# Patient Record
Sex: Male | Born: 1995 | Race: Black or African American | Hispanic: No | Marital: Single | Smoking: Never smoker
Health system: Southern US, Community
[De-identification: ages and names within clinical notes are randomized; demographics above are authoritative.]

## PROBLEM LIST (undated history)

## (undated) HISTORY — PX: NO PAST SURGERIES: SHX2092

---
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
## 2013-06-29 ENCOUNTER — Ambulatory Visit: Payer: Self-pay | Admitting: Medical

## 2013-06-29 LAB — RAPID INFLUENZA A&B ANTIGENS (ARMC ONLY)

## 2013-06-29 LAB — RAPID STREP-A WITH REFLX: Micro Text Report: POSITIVE

## 2013-10-26 ENCOUNTER — Ambulatory Visit: Payer: Self-pay | Admitting: Family Medicine

## 2015-03-31 ENCOUNTER — Encounter

## 2015-03-31 DIAGNOSIS — S86812A Strain of other muscle(s) and tendon(s) at lower leg level, left leg, initial encounter: Secondary | ICD-10-CM | POA: Diagnosis not present

## 2015-03-31 DIAGNOSIS — S86912A Strain of unspecified muscle(s) and tendon(s) at lower leg level, left leg, initial encounter: Secondary | ICD-10-CM

## 2015-03-31 DIAGNOSIS — M25562 Pain in left knee: Secondary | ICD-10-CM | POA: Diagnosis not present

## 2015-06-15 ENCOUNTER — Encounter

## 2015-06-15 ENCOUNTER — Encounter: Payer: Self-pay | Admitting: Emergency Medicine

## 2015-06-15 DIAGNOSIS — R079 Chest pain, unspecified: Secondary | ICD-10-CM

## 2015-06-15 DIAGNOSIS — R0602 Shortness of breath: Secondary | ICD-10-CM | POA: Insufficient documentation

## 2015-06-15 MED ADMIN — Aspirin Chew Tab 81 MG: 324 mg | ORAL | NDC 63739043401

## 2015-08-20 ENCOUNTER — Encounter: Payer: Self-pay | Admitting: *Deleted

## 2015-08-20 ENCOUNTER — Encounter

## 2015-08-20 DIAGNOSIS — R0789 Other chest pain: Secondary | ICD-10-CM

## 2015-09-09 ENCOUNTER — Encounter

## 2019-12-03 ENCOUNTER — Encounter: Payer: Self-pay | Admitting: Emergency Medicine

## 2019-12-03 ENCOUNTER — Encounter

## 2019-12-03 DIAGNOSIS — S29019A Strain of muscle and tendon of unspecified wall of thorax, initial encounter: Secondary | ICD-10-CM

## 2020-03-10 ENCOUNTER — Encounter: Payer: Self-pay | Admitting: Emergency Medicine

## 2020-03-10 DIAGNOSIS — M25562 Pain in left knee: Secondary | ICD-10-CM

## 2020-08-25 ENCOUNTER — Encounter

## 2020-08-25 ENCOUNTER — Encounter: Payer: Self-pay | Admitting: Emergency Medicine

## 2020-08-25 DIAGNOSIS — S92424A Nondisplaced fracture of distal phalanx of right great toe, initial encounter for closed fracture: Secondary | ICD-10-CM

## 2020-08-25 DIAGNOSIS — Y9367 Activity, basketball: Secondary | ICD-10-CM | POA: Diagnosis not present

## 2020-10-09 ENCOUNTER — Encounter

## 2020-10-09 DIAGNOSIS — S46912A Strain of unspecified muscle, fascia and tendon at shoulder and upper arm level, left arm, initial encounter: Secondary | ICD-10-CM

## 2020-10-09 DIAGNOSIS — M25512 Pain in left shoulder: Secondary | ICD-10-CM

## 2021-01-14 ENCOUNTER — Encounter

## 2021-01-14 ENCOUNTER — Other Ambulatory Visit: Payer: Self-pay

## 2021-01-14 ENCOUNTER — Encounter: Payer: Self-pay | Admitting: *Deleted

## 2021-01-14 ENCOUNTER — Emergency Department: Payer: Self-pay

## 2021-01-14 DIAGNOSIS — Z23 Encounter for immunization: Secondary | ICD-10-CM | POA: Insufficient documentation

## 2021-01-14 DIAGNOSIS — Y9364 Activity, baseball: Secondary | ICD-10-CM | POA: Insufficient documentation

## 2021-01-14 DIAGNOSIS — S022XXA Fracture of nasal bones, initial encounter for closed fracture: Secondary | ICD-10-CM | POA: Insufficient documentation

## 2021-01-14 DIAGNOSIS — S0990XA Unspecified injury of head, initial encounter: Secondary | ICD-10-CM | POA: Insufficient documentation

## 2021-01-14 DIAGNOSIS — W2107XA Struck by softball, initial encounter: Secondary | ICD-10-CM | POA: Insufficient documentation

## 2021-01-14 NOTE — ED Triage Notes (Signed)
Pt was hit in the nose tonight with a softball.  States the ball took a hop and struck pt in the nose.  Small lac to bridge of nose.  Nose swollen.  Dried blood noticed in nares.  No loc  no n/v/  pt alert  speech clear.  Pt has a headache.

## 2021-01-15 ENCOUNTER — Emergency Department
Admission: EM | Admit: 2021-01-15 | Discharge: 2021-01-15 | Disposition: A | Discharge status: Home / Self Care | Payer: Self-pay | Attending: Emergency Medicine | Admitting: Emergency Medicine

## 2021-01-15 ENCOUNTER — Encounter: Payer: Self-pay | Admitting: Emergency Medicine

## 2021-01-15 DIAGNOSIS — S022XXA Fracture of nasal bones, initial encounter for closed fracture: Secondary | ICD-10-CM

## 2021-01-15 DIAGNOSIS — S0990XA Unspecified injury of head, initial encounter: Secondary | ICD-10-CM

## 2021-01-15 MED ORDER — IBUPROFEN 800 MG PO TABS
800.0000 mg | ORAL_TABLET | Freq: Once | ORAL | Status: AC
  Administered 2021-01-15: 800 mg via ORAL
  Filled 2021-01-15: qty 1

## 2021-01-15 MED ORDER — HYDROCODONE-ACETAMINOPHEN 5-325 MG PO TABS: 2.0000 | ORAL_TABLET | Freq: Four times a day (QID) | ORAL | 0 refills | Status: DC | PRN

## 2021-01-15 MED ORDER — BACITRACIN ZINC 500 UNIT/GM EX OINT
TOPICAL_OINTMENT | Freq: Once | CUTANEOUS | Status: AC
  Filled 2021-01-15: qty 0.9

## 2021-01-15 MED ORDER — ONDANSETRON 4 MG PO TBDP: 4.0000 mg | ORAL_TABLET | Freq: Four times a day (QID) | ORAL | 0 refills | Status: DC | PRN

## 2021-01-15 MED ORDER — IBUPROFEN 800 MG PO TABS
800.0000 mg | ORAL_TABLET | Freq: Three times a day (TID) | ORAL | 0 refills | Status: DC | PRN
Start: 2021-01-15 — End: 2022-07-22

## 2021-01-15 MED ORDER — TETANUS-DIPHTH-ACELL PERTUSSIS 5-2.5-18.5 LF-MCG/0.5 IM SUSY
0.5000 mL | PREFILLED_SYRINGE | Freq: Once | INTRAMUSCULAR | Status: AC
  Administered 2021-01-15: 0.5 mL via INTRAMUSCULAR
  Filled 2021-01-15: qty 0.5

## 2021-01-15 MED ORDER — ONDANSETRON 4 MG PO TBDP
4.0000 mg | ORAL_TABLET | Freq: Once | ORAL | Status: AC
  Administered 2021-01-15: 4 mg via ORAL
  Filled 2021-01-15: qty 1

## 2021-01-15 MED ORDER — HYDROCODONE-ACETAMINOPHEN 5-325 MG PO TABS
2.0000 | ORAL_TABLET | Freq: Once | ORAL | Status: AC
  Administered 2021-01-15: 2 via ORAL
  Filled 2021-01-15: qty 2

## 2021-01-15 NOTE — ED Notes (Signed)
EDP at bedside  

## 2021-01-15 NOTE — Discharge Instructions (Addendum)
CT IMPRESSION:  1. Comminuted and depressed nasal bone fracture involving both the  right and left nasal bone.  2. Fracture of the anterior aspect of the nasal septum with minimal  buckling.   You have a small laceration to the bridge of your nose that does not require sutures.  You may clean this area gently with warm soap and water and apply over-the-counter Neosporin once daily.  We have updated your tetanus vaccination today.  You have had a difficult head injury today.  Please avoid alcohol, sedatives, illicit drugs for the next week other medications that have been prescribed to you.  Please rest and drink plenty of water.  We recommend that you avoid any activity that may lead to another head injury for at least 1 week or until your symptoms have completely resolved.  We also recommend "brain rest" - please avoid TV, cell phones, tablets, computers as much as possible for the next 48 hours.  If you develop severe headache that is uncontrolled with pain medication at home, numbness or weakness on one side of your body, vomiting that does not stop, confusion, changes in your speech or vision, please return to the emergency department.  I recommend that you do not blowing your nose or put anything into your nose for the next 3 days.  I recommend sleeping with several pillows under your head or keeping the head of your bed elevated to help with pain, swelling.  You may apply ice to your face to help with pain and swelling.  You will likely develop black eyes over the next several days.  If you develop a nosebleed that does not stop with holding direct pressure for 30 minutes, please return to the emergency department.  I recommend follow-up with ENT in the next 2 weeks after your swelling has improved.   You are being provided a prescription for opiates (also known as narcotics) for pain control.  Opiates can be addictive and should only be used when absolutely necessary for pain control when other  alternatives do not work.  We recommend you only use them for the recommended amount of time and only as prescribed.  Please do not take with other sedative medications or alcohol.  Please do not drive, operate machinery, make important decisions while taking opiates.  Please note that these medications can be addictive and have high abuse potential.  Patients can become addicted to narcotics after only taking them for a few days.  Please keep these medications locked away from children, teenagers or any family members with history of substance abuse.  Narcotic pain medicine may also make you constipated.  You may use over-the-counter medications such as MiraLAX, Colace to prevent constipation.  If you become constipated you may use over-the-counter enemas as needed.  Itching and nausea are common side effects of narcotic pain medication.  If you develop uncontrolled vomiting or a rash, please stop these medications.    Steps to find a Primary Care Provider (PCP):  Call [phone] or [phone] to access "[facility] Find a Doctor Service."  2.  You may also go on the [facility] website at [email or web address]

## 2021-01-15 NOTE — ED Provider Notes (Signed)
[facility] Emergency Department Provider Note ____________________________________________   Event Date/Time   First MD Initiated Contact with Patient 01/15/21 0202     (approximate)  I have reviewed the triage vital signs and the nursing notes.   HISTORY  Chief Complaint Facial Injury    HPI Bernard Robinson is a 25 y.o. male with no significant past medical history who presents to the emergency department with complaints of nasal pain, headache after he was hit in the face with a softball tonight just prior to arrival.  Has a small abrasion to the bridge of his nose.  No loss of consciousness.  No neck or back pain.  No chest or abdominal pain.  No numbness or weakness.  No vomiting.  Not on blood thinners.  Unsure of last tetanus vaccination.         History reviewed. No pertinent past medical history.  There are no problems to display for this patient.   History reviewed. No pertinent surgical history.  Prior to Admission medications   Medication Sig Start Date End Date Taking? Authorizing Provider  HYDROcodone-acetaminophen (NORCO/VICODIN) 5-325 MG tablet Take 2 tablets by mouth every 6 (six) hours as needed. 01/15/21  Yes Buryl Bamber, Layla Maw, DO  ibuprofen (ADVIL) 800 MG tablet Take 1 tablet (800 mg total) by mouth every 8 (eight) hours as needed for mild pain. 01/15/21  Yes Rashad Obeid, Baxter Hire N, DO  ondansetron (ZOFRAN ODT) 4 MG disintegrating tablet Take 1 tablet (4 mg total) by mouth every 6 (six) hours as needed for nausea or vomiting. 01/15/21  Yes Jakyron Fabro, Layla Maw, DO    Allergies Patient has no known allergies.  History reviewed. No pertinent family history.  Social History Social History   Tobacco Use   Smoking status: Never   Smokeless tobacco: Never  Substance Use Topics   Alcohol use: Yes    Review of Systems Constitutional: No fever. Eyes: No visual changes. ENT: No sore throat. Cardiovascular: Denies chest pain. Respiratory:  Denies shortness of breath. Gastrointestinal: No nausea, vomiting, diarrhea. Genitourinary: Negative for dysuria. Musculoskeletal: Negative for back pain. Skin: Negative for rash. Neurological: Negative for focal weakness or numbness.   ____________________________________________   PHYSICAL EXAM:  VITAL SIGNS: ED Triage Vitals  Enc Vitals Group     BP 01/14/21 2227 (!) 161/87     Pulse Rate 01/14/21 2227 77     Resp 01/14/21 2227 18     Temp 01/14/21 2227 98.5 F (36.9 C)     Temp Source 01/14/21 2227 Oral     SpO2 01/14/21 2227 95 %     Weight 01/14/21 2226 240 lb (108.9 kg)     Height 01/14/21 2226 5\' 10"  (1.778 m)     Head Circumference --      Peak Flow --      Pain Score 01/14/21 2240 8     Pain Loc --      Pain Edu? --      Excl. in [place]? --    CONSTITUTIONAL: Alert and oriented and responds appropriately to questions. Well-appearing; well-nourished; GCS 15 HEAD: Normocephalic; atraumatic EYES: Conjunctivae clear, PERRL, EOMI ENT: Nose is significantly swollen with ecchymosis.  There is a small abrasion across the bridge of the nose.  No septal hematoma.  No epistaxis.  No dental injury.  No trismus.  Normal phonation.  No other facial tenderness. NECK: Supple, no meningismus, no LAD; no midline spinal tenderness, step-off or deformity; trachea midline CARD: RRR; S1 and S2  appreciated; no murmurs, no clicks, no rubs, no gallops RESP: Normal chest excursion without splinting or tachypnea; breath sounds clear and equal bilaterally; no wheezes, no rhonchi, no rales; no hypoxia or respiratory distress CHEST:  chest wall stable, no crepitus or ecchymosis or deformity, nontender to palpation; no flail chest ABD/GI: Normal bowel sounds; non-distended; soft, non-tender, no rebound, no guarding; no ecchymosis or other lesions noted PELVIS:  stable, nontender to palpation BACK:  The back appears normal and is non-tender to palpation, there is no CVA tenderness; no midline spinal  tenderness, step-off or deformity EXT: Normal ROM in all joints; non-tender to palpation; no edema; normal capillary refill; no cyanosis, no bony tenderness or bony deformity of patient's extremities, no joint effusion, compartments are soft, extremities are warm and well-perfused, no ecchymosis SKIN: Normal color for age and race; warm NEURO: Moves all extremities equally, normal speech, no facial asymmetry, normal sensation diffusely, normal gait PSYCH: The patient's mood and manner are appropriate. Grooming and personal hygiene are appropriate.  ____________________________________________   LABS (all labs ordered are listed, but only abnormal results are displayed)  Labs Reviewed - No data to display ____________________________________________  EKG   ____________________________________________  RADIOLOGY I, Aubriauna Riner, personally viewed and evaluated these images (plain radiographs) as part of my medical decision making, as well as reviewing the written report by the radiologist.  ED MD interpretation: CT scan shows nasal bone fractures.  No intracranial abnormality.  Official radiology report(s): CT HEAD WO CONTRAST ( )  Result Date: 01/14/2021 CLINICAL DATA:  Facial trauma. Struck in the face/nose tonight with a softball. Laceration to bridge of nose with nasal swelling. No loss of consciousness. Headache. EXAM: CT HEAD WITHOUT CONTRAST TECHNIQUE: Contiguous axial images were obtained from the base of the skull through the vertex without intravenous contrast. COMPARISON:  None. FINDINGS: Brain: No intracranial hemorrhage, mass effect, or midline shift. No hydrocephalus. The basilar cisterns are patent. No evidence of territorial infarct or acute ischemia. No extra-axial or intracranial fluid collection. Vascular: No hyperdense vessel or unexpected calcification. Skull: No fracture or focal lesion. Sinuses/Orbits: Assessed on concurrent face CT, reported separately. Other:  Minimal linear density in the right frontal scalp is likely related to scarring. There is no soft tissue air. IMPRESSION: No acute intracranial abnormality. No skull fracture. Electronically Signed   By: Narda Rutherford M.D.   On: 01/14/2021 23:17   CT Maxillofacial Wo Contrast  Result Date: 01/14/2021 CLINICAL DATA:  Facial trauma. Struck in the face/nose with a softball. Nasal laceration and nasal swelling. EXAM: CT MAXILLOFACIAL WITHOUT CONTRAST TECHNIQUE: Multidetector CT imaging of the maxillofacial structures was performed. Multiplanar CT image reconstructions were also generated. COMPARISON:  None. FINDINGS: Osseous: Comminuted and depressed nasal bone fracture involving both the right and left nasal bone. There is mild rightward displacement of both nasal bone fractures. There is a fracture of the anterior aspect of the nasal septum with minimal buckling. Minimal leftward nasal septal deviation. No additional facial bone fracture. No fracture of the zygomatic arches, maxilla, or mandibles. The temporomandibular joints are congruent. Occasional dental caries and absent teeth. Orbits: No orbital fracture.  No globe injury. Sinuses: No sinus fracture or fluid level. Mastoid air cells are clear. Soft tissues: Prominent nasal soft tissue edema, more so on the right. Limited intracranial: Assessed on concurrent head CT, reported separately IMPRESSION: 1. Comminuted and depressed nasal bone fracture involving both the right and left nasal bone. 2. Fracture of the anterior aspect of the nasal septum with minimal buckling.  Electronically Signed   By: Narda Rutherford M.D.   On: 01/14/2021 23:20    ____________________________________________   PROCEDURES  Procedure(s) performed (including Critical Care):  Procedures    ____________________________________________   INITIAL IMPRESSION / ASSESSMENT AND PLAN / ED COURSE  As part of my medical decision making, I reviewed the following data within  the electronic MEDICAL RECORD NUMBER History obtained from family, Nursing notes reviewed and incorporated, Old chart reviewed, CT reviewed, Notes from prior ED visits, and [place] Controlled Substance Database         Patient here with head injury.  CT head shows no skull fracture or intracranial abnormality.  CT of the face shows bilateral comminuted nasal bone fractures as well as a fracture of the nasal septum.  There is no septal hematoma on exam.  No epistaxis.  He is neurologically intact here.  Will give pain medication.  Has a small abrasion to the bridge of his nose that does not require laceration repair.  I have cleaned this wound.  We will update his tetanus vaccination.  Discussed head injury return precautions and supportive care instructions.  Given ENT follow-up.  Patient and friend at bedside comfortable with plan.  At this time, I do not feel there is any life-threatening condition present. I have reviewed, interpreted and discussed all results (EKG, imaging, lab, urine as appropriate) and exam findings with patient/family. I have reviewed nursing notes and appropriate previous records.  I feel the patient is safe to be discharged home without further emergent workup and can continue workup as an outpatient as needed. Discussed usual and customary return precautions. Patient/family verbalize understanding and are comfortable with this plan.  Outpatient follow-up has been provided as needed. All questions have been answered.    ____________________________________________   FINAL CLINICAL IMPRESSION(S) / ED DIAGNOSES  Final diagnoses:  Closed fracture of nasal bone, initial encounter  Injury of head, initial encounter     ED Discharge Orders          Ordered    HYDROcodone-acetaminophen (NORCO/VICODIN) 5-325 MG tablet  Every 6 hours PRN        01/15/21 0232    ibuprofen (ADVIL) 800 MG tablet  Every 8 hours PRN        01/15/21 0232    ondansetron (ZOFRAN ODT) 4 MG disintegrating  tablet  Every 6 hours PRN        01/15/21 0232            *Please note:  Bernard Robinson was evaluated in Emergency Department on 01/15/2021 for the symptoms described in the history of present illness. He was evaluated in the context of the global COVID-19 pandemic, which necessitated consideration that the patient might be at risk for infection with the SARS-CoV-2 virus that causes COVID-19. Institutional protocols and algorithms that pertain to the evaluation of patients at risk for COVID-19 are in a state of rapid change based on information released by regulatory bodies including the CDC and federal and state organizations. These policies and algorithms were followed during the patient's care in the ED.  Some ED evaluations and interventions may be delayed as a result of limited staffing during and the pandemic.*   Note:  This document was prepared using Dragon voice recognition software and may include unintentional dictation errors.    Parks Czajkowski, Layla Maw, DO 01/15/21 [phone]

## 2021-01-15 NOTE — ED Notes (Signed)
Discharge reviewed with patient. Patient verbally states understanding. Patient discharged.

## 2021-01-15 NOTE — ED Notes (Signed)
Patient states he was hit with ball in nose. Patient denies LOC. States pain is 7/10 headache.

## 2022-01-06 IMAGING — CT CT HEAD W/O CM
3 series · 15 of 47 positions shown, 18 images · non-contrast
Comparison: None.

CLINICAL DATA: Facial trauma. Struck in the face/nose tonight with
a softball. Laceration to bridge of nose with nasal swelling. No
loss of consciousness. Headache.

EXAM:
CT HEAD WITHOUT CONTRAST
TECHNIQUE: Contiguous axial images were obtained from the base of the skull
through the vertex without intravenous contrast.

[Series 3: head wo · axial · 0.46mm/px · z∈[-127,+8]mm · 9 of 33 slices shown, 12 images]
[im 3/33  brain]
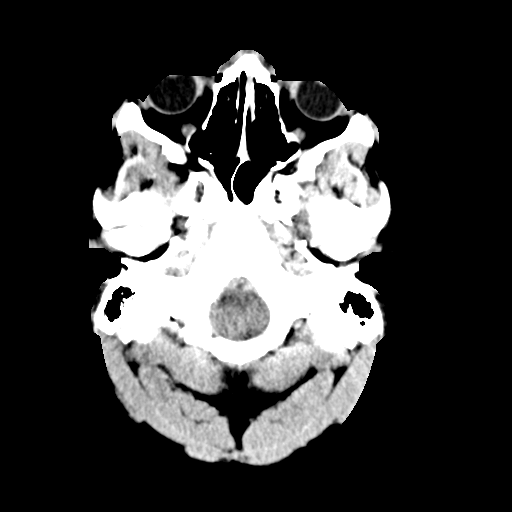
[im 3/33  bone]
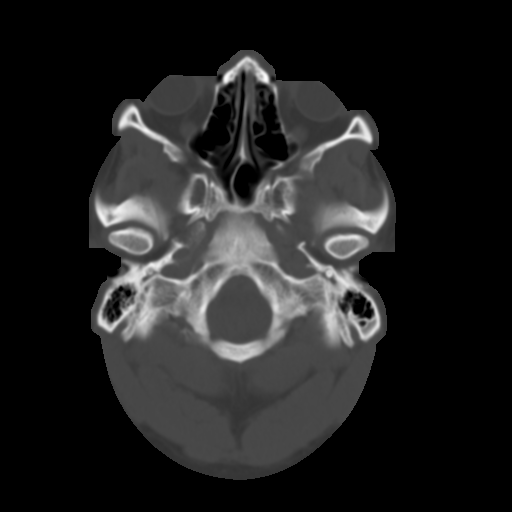
[im 6/33  brain]
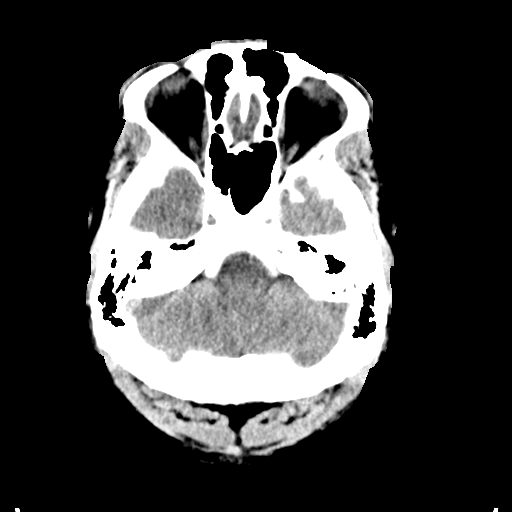
[im 9/33  brain]
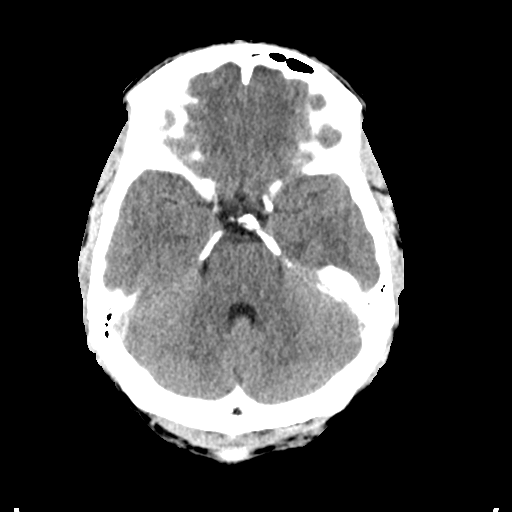
[im 13/33  brain]
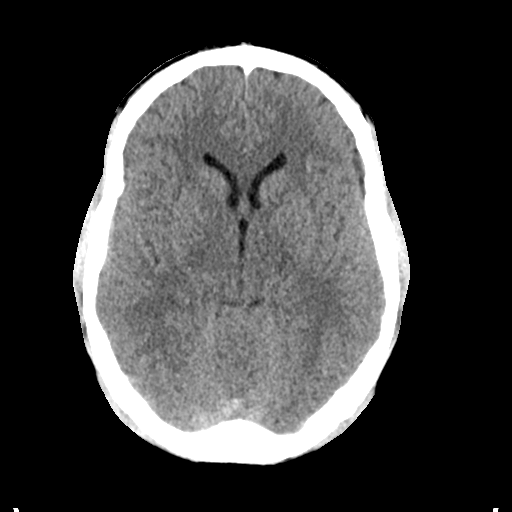
[im 17/33  brain]
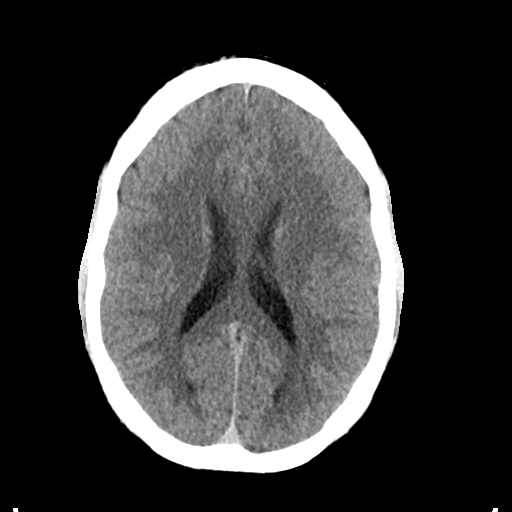
[im 17/33  bone]
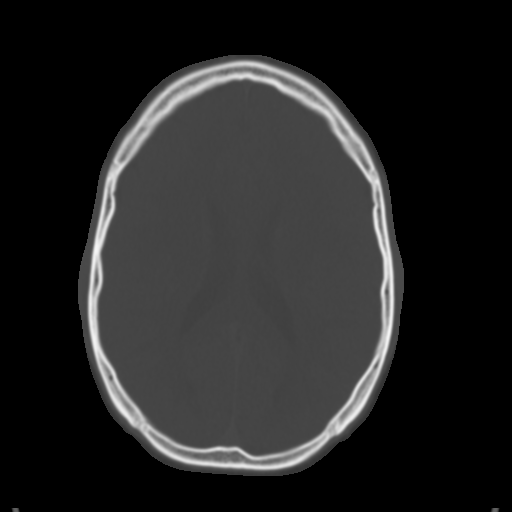
[im 20/33  brain]
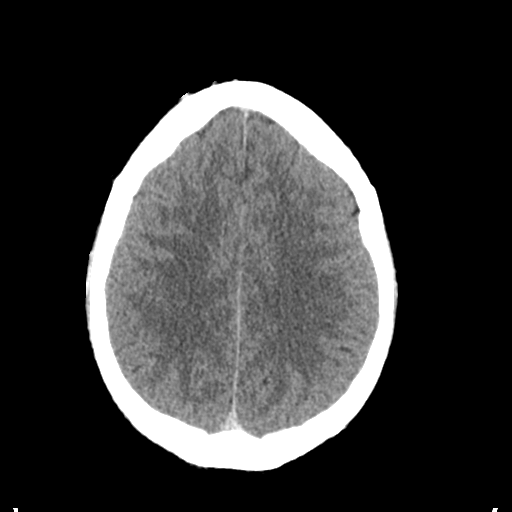
[im 24/33  brain]
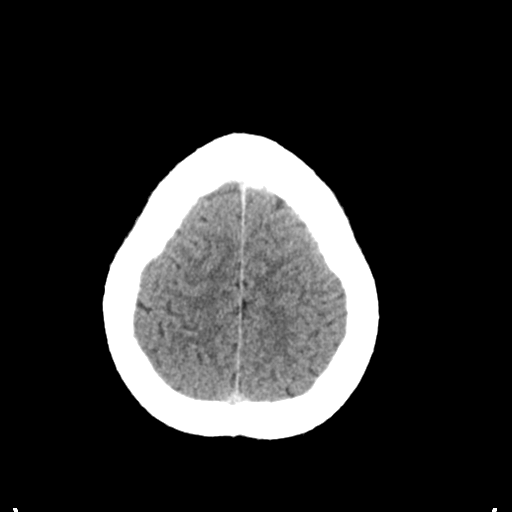
[im 27/33  brain]
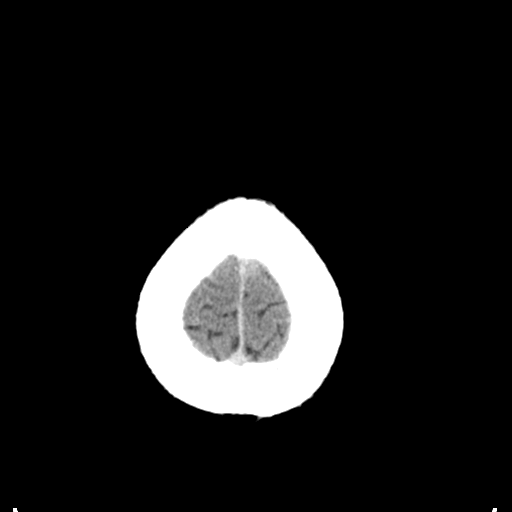
[im 30/33  brain]
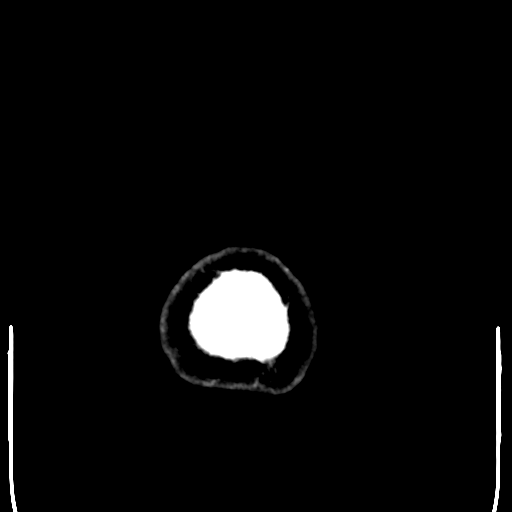
[im 30/33  bone]
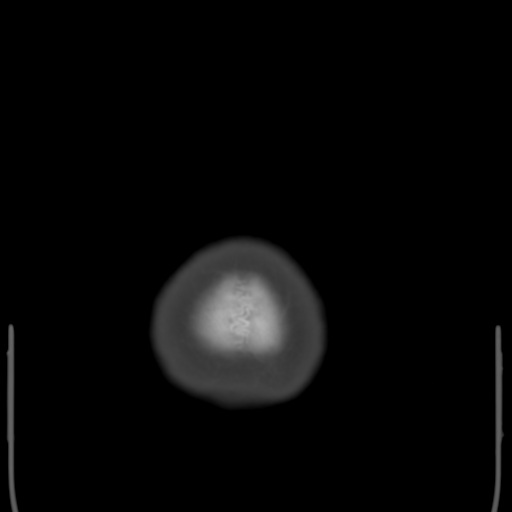

[Series 4: coronal soft tissue · coronal · 0.33mm/px · 3 of 71 slices shown]
[im 24/71  brain]
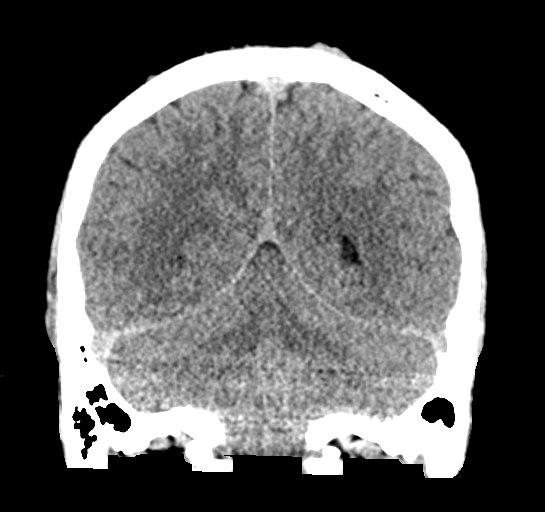
[im 32/71  brain]
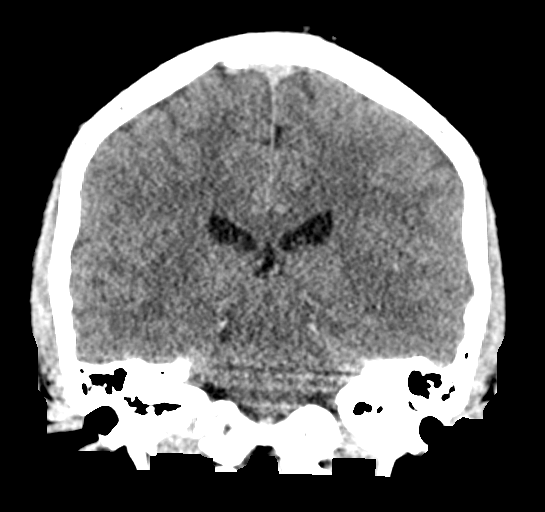
[im 39/71  brain]
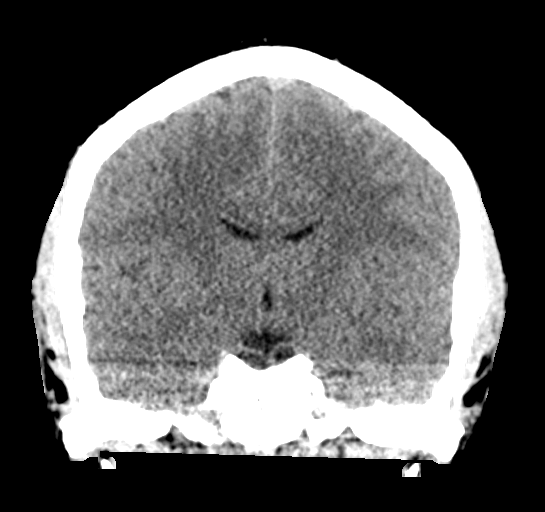

[Series 5: sagittal soft tissue · sagittal · 0.33mm/px · 3 of 60 slices shown]
[im 20/60  brain]
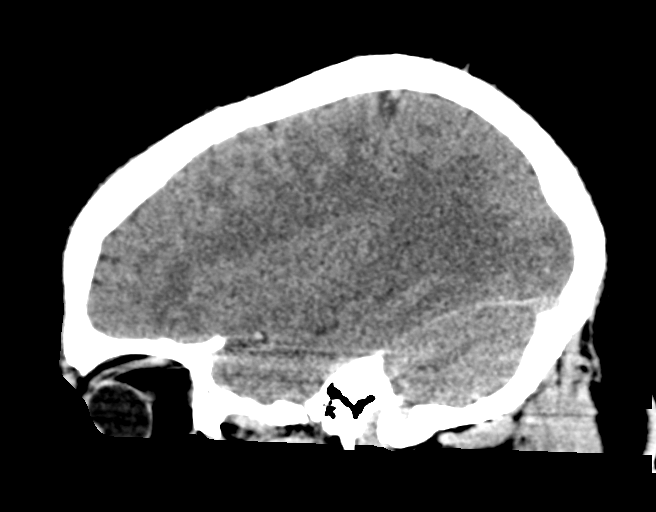
[im 30/60  brain]
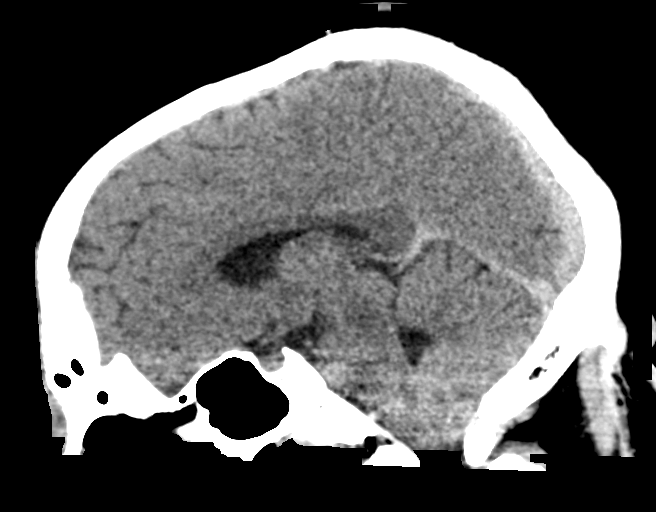
[im 40/60  brain]
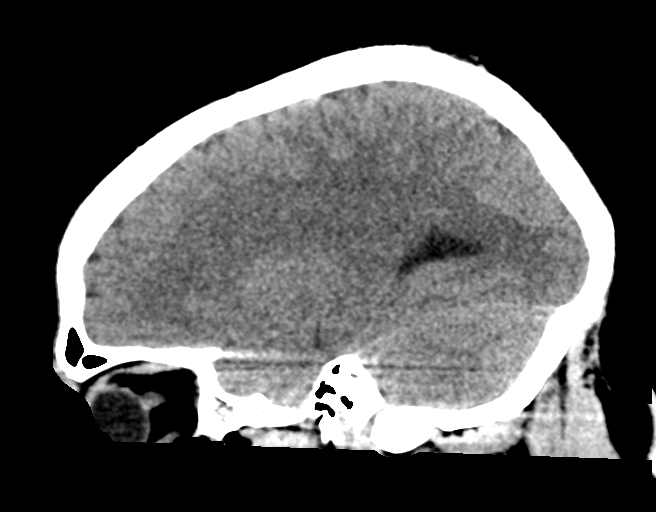

[15 of 47 positions shown; findings below may reference images not displayed]

FINDINGS: Brain: No intracranial hemorrhage, mass effect, or midline shift. No
hydrocephalus. The basilar cisterns are patent. No evidence of
territorial infarct or acute ischemia. No extra-axial or
intracranial fluid collection.

Vascular: No hyperdense vessel or unexpected calcification.

Skull: No fracture or focal lesion.

Sinuses/Orbits: Assessed on concurrent face CT, reported separately.

Other: Minimal linear density in the right frontal scalp is likely
related to scarring. There is no soft tissue air.
IMPRESSION: No acute intracranial abnormality. No skull fracture.

## 2022-07-22 ENCOUNTER — Ambulatory Visit
Admission: EM | Admit: 2022-07-22 | Discharge: 2022-07-22 | Disposition: A | Discharge status: Home / Self Care | Payer: 59 | Attending: Family Medicine | Admitting: Family Medicine

## 2022-07-22 DIAGNOSIS — R030 Elevated blood-pressure reading, without diagnosis of hypertension: Secondary | ICD-10-CM | POA: Insufficient documentation

## 2022-07-22 DIAGNOSIS — K047 Periapical abscess without sinus: Secondary | ICD-10-CM | POA: Diagnosis not present

## 2022-07-22 LAB — GROUP A STREP BY PCR: Group A Strep by PCR: NOT DETECTED

## 2022-07-22 MED ORDER — IBUPROFEN 800 MG PO TABS: 800.0000 mg | ORAL_TABLET | Freq: Three times a day (TID) | ORAL | 0 refills | Status: DC | PRN

## 2022-07-22 MED ORDER — PENICILLIN V POTASSIUM 500 MG PO TABS: 500.0000 mg | ORAL_TABLET | Freq: Four times a day (QID) | ORAL | 0 refills | Status: AC

## 2022-07-22 NOTE — Discharge Instructions (Addendum)
Follow up with your primary provider regarding your elevated blood pressures today. Get a blood pressure monitor at the pharmacy or online. Take your blood pressure in the morning around the same time for the next 2 weeks.   At this time there is no abscess to be drained. You were prescribed an antibiotic. Please take this exactly as directed and do not stop taking it until the entire course of medicine is finished, even if you begin to feel better before finishing the course. We have also given you pain medication. Follow up with your primary care provider as needed.  Keep your scheduled appointment with your dentist. Go to ED for red flag symptoms, including; fevers you cannot reduce with Tylenol/Motrin, severe headaches, vision changes, numbness/weakness in part of the body, intractable vomiting, severe dehydration, signs of severe infection (increased redness, swelling of an area), feeling faint or passing out, dizziness, etc. You should especially go to the ED for sudden acute worsening of condition if you do not elect to go at this time.

## 2022-07-22 NOTE — ED Provider Notes (Addendum)
[facility]    CSN: [id number] Arrival date & time: 07/22/22  1032      History   Chief Complaint Chief Complaint  Patient presents with   Facial Pain    HPI Bernard Robinson is a 27 y.o. male.   HPI   Bernard Robinson presents for left sided facial pain with swelling that started on Tuesday. Pain increased after eating. Has throbbing pain and swelling started the next day. He has a dental appointment upcoming. Feels like he can't open his mouth so wide. It hurts to talk but has pain in the lower part of his jaw. It hurts when he tries to swallow. When he tries to go to sleep he feels tension on the left side of his face. Had chills and leg pain that resolved.   Monday he had nausea, vomiting, diarrhea and body aches.    Fever : no  Sore throat: yes Cough: no Nasal congestion : no  Rhinorrhea: no Myalgias: no Appetite: normal  Hydration: normal  Abdominal pain: no Nausea: yes Vomiting: yes Diarrhea: yes  Rash: No Sleep disturbance: yes  Headache: yes       History reviewed. No pertinent past medical history.  There are no problems to display for this patient.   History reviewed. No pertinent surgical history.     Home Medications    Prior to Admission medications   Medication Sig Start Date End Date Taking? Authorizing Provider  penicillin v potassium (VEETID) 500 MG tablet Take 1 tablet (500 mg total) by mouth 4 (four) times daily for 10 days. 07/22/22 08/01/22 Yes Nechelle Petrizzo, Ronnette Juniper, DO  HYDROcodone-acetaminophen (NORCO/VICODIN) 5-325 MG tablet Take 2 tablets by mouth every 6 (six) hours as needed. 01/15/21   Ward, Delice Bison, DO  ibuprofen (ADVIL) 800 MG tablet Take 1 tablet (800 mg total) by mouth every 8 (eight) hours as needed for mild pain. 07/22/22   Chevis Weisensel, Ronnette Juniper, DO  ondansetron (ZOFRAN ODT) 4 MG disintegrating tablet Take 1 tablet (4 mg total) by mouth every 6 (six) hours as needed for nausea or vomiting. 01/15/21   Ward, Delice Bison, DO    Family  History History reviewed. No pertinent family history.  Social History Social History   Tobacco Use   Smoking status: Never   Smokeless tobacco: Never  Substance Use Topics   Alcohol use: Yes     Allergies   Patient has no known allergies.   Review of Systems Review of Systems: negative unless otherwise stated in HPI.      Physical Exam Triage Vital Signs ED Triage Vitals [07/22/22 1039]  Enc Vitals Group     BP (!) 181/99     Pulse Rate 82     Resp      Temp 98.5 F (36.9 C)     Temp src      SpO2 96 %     Weight 253 lb (114.8 kg)     Height '5\' 10"'$  (1.778 m)     Head Circumference      Peak Flow      Pain Score 10     Pain Loc      Pain Edu?      Excl. in [place]?    No data found.  Updated Vital Signs BP (!) 174/119 (BP Location: Left Arm)   Pulse 82   Temp 98.5 F (36.9 C)   Ht '5\' 10"'$  (1.778 m)   Wt 114.8 kg   SpO2 96%   BMI 36.30 kg/m  Visual Acuity Right Eye Distance:   Left Eye Distance:   Bilateral Distance:    Right Eye Near:   Left Eye Near:    Bilateral Near:     Physical Exam GEN:     alert, non-ill appearing male in no distress    HENT:  mucus membranes moist, oropharyngeal without lesions or exudate, no tonsillar hypertrophy, crack-off at the posterior left molar that is tender to percussion, pain with palpation of external lower left jaw, difficulty opening his mouth due to pain, to TMJ tenderness bilaterally  EYES:   pupils equal and reactive, no scleral injection or discharge NECK:  normal ROM, no lymphadenopathy, no meningismus   RESP:  no increased work of breathing, clear to auscultation bilaterally CVS:   regular rate and rhythm Skin:   warm and dry    UC Treatments / Results  Labs (all labs ordered are listed, but only abnormal results are displayed) Labs Reviewed  GROUP A STREP BY PCR    EKG   Radiology No results found.  Procedures Procedures (including critical care time)  Medications Ordered in  UC Medications - No data to display  Initial Impression / Assessment and Plan / UC Course  I have reviewed the triage vital signs and the nursing notes.  Pertinent labs & imaging results that were available during my care of the patient were reviewed by me and considered in my medical decision making (see chart for details).      Pt is a 27 y.o. male who presents for several days of dental pain. Finnie is afebrile here without recent antipyretics. Satting well on room air. Overall pt is well appearing, well hydrated, without respiratory distress.  Dental exam concerning for dental abscess. He has a crack-off on his left posterior molar. Will treat with penicillin-V-potassium QID for 10 days. Advised to keep his appointment with his dentist.  - continue Tylenol with Motrin as needed for discomfort - Gargle with salt water several times a day  His is hypertensive, initially 181/99 and repeat after at least 15 minutes is 174/119.  Cardiopulmonary exam unremarkable. Pain may account for some of this but I question if he has underlying hypertension. Advised to monitor BP and speak with his PCP about his blood pressures.    Discussed MDM, treatment plan and plan for follow-up with patient who agrees with plan.      Final Clinical Impressions(s) / UC Diagnoses   Final diagnoses:  Dental abscess  Elevated blood pressure reading without diagnosis of hypertension     Discharge Instructions      Follow up with your primary provider regarding your elevated blood pressures today. Get a blood pressure monitor at the pharmacy or online. Take your blood pressure in the morning around the same time for the next 2 weeks.   At this time there is no abscess to be drained. You were prescribed an antibiotic. Please take this exactly as directed and do not stop taking it until the entire course of medicine is finished, even if you begin to feel better before finishing the course. We have also given you  pain medication. Follow up with your primary care provider as needed.  Keep your scheduled appointment with your dentist. Go to ED for red flag symptoms, including; fevers you cannot reduce with Tylenol/Motrin, severe headaches, vision changes, numbness/weakness in part of the body, intractable vomiting, severe dehydration, signs of severe infection (increased redness, swelling of an area), feeling faint or passing out, dizziness, etc.  You should especially go to the ED for sudden acute worsening of condition if you do not elect to go at this time.       ED Prescriptions     Medication Sig Dispense Auth. Provider   penicillin v potassium (VEETID) 500 MG tablet Take 1 tablet (500 mg total) by mouth 4 (four) times daily for 10 days. 40 tablet Nicolae Vasek, DO   ibuprofen (ADVIL) 800 MG tablet Take 1 tablet (800 mg total) by mouth every 8 (eight) hours as needed for mild pain. 30 tablet Lyndee Hensen, DO      PDMP not reviewed this encounter.       Lyndee Hensen, DO 07/22/22 1522

## 2022-07-22 NOTE — ED Triage Notes (Signed)
Pt c/o LT sided facial pain, mouth pain, pt states hurting to swallow, pt states does not feel like strep throat. Pt states he did make an appointment to see dentist in case this was tooth related.

## 2022-12-02 ENCOUNTER — Ambulatory Visit
Admission: EM | Admit: 2022-12-02 | Discharge: 2022-12-02 | Disposition: A | Discharge status: Home / Self Care | Payer: 59 | Attending: Physician Assistant | Admitting: Physician Assistant

## 2022-12-02 ENCOUNTER — Ambulatory Visit: Payer: 59

## 2022-12-02 DIAGNOSIS — M542 Cervicalgia: Secondary | ICD-10-CM | POA: Diagnosis not present

## 2022-12-02 DIAGNOSIS — M25511 Pain in right shoulder: Secondary | ICD-10-CM

## 2022-12-02 DIAGNOSIS — G8929 Other chronic pain: Secondary | ICD-10-CM

## 2022-12-02 DIAGNOSIS — M5030 Other cervical disc degeneration, unspecified cervical region: Secondary | ICD-10-CM | POA: Diagnosis not present

## 2022-12-02 MED ORDER — TIZANIDINE HCL 4 MG PO TABS: 4.0000 mg | ORAL_TABLET | Freq: Three times a day (TID) | ORAL | 0 refills | Status: AC | PRN

## 2022-12-02 MED ORDER — NAPROXEN 500 MG PO TABS: 500.0000 mg | ORAL_TABLET | Freq: Two times a day (BID) | ORAL | 1 refills | Status: AC

## 2022-12-02 NOTE — ED Triage Notes (Signed)
Pt c/o right shoulder pain x2years  Pt states that it hurts mainly when playing sports but has been getting more frequent over the last 3 months.   Pt states that he can lift and rotate his arm but can't do it fully.   Pt states that the pain is now in his neck and he has trouble sleeping.   Pt states that there is numbness and tingling in his fingers.   Pt denies any elbow or arm pain.   Pt states that raising his arm above his head is tight and causes pain.  Pt states that he can not lift weights due to the shoulder pain and that if he lifts or pushes with his right arm it causes pain.

## 2022-12-02 NOTE — ED Provider Notes (Addendum)
[facility]    CSN: [id number] Arrival date & time: 12/02/22  0915      History   Chief Complaint Chief Complaint  Patient presents with   Shoulder Pain    HPI Bernard Robinson is a 27 y.o. male presenting for right shoulder pain intermittently for the past 2 years.  Pain is seem to be getting worse over the past 3 months since he started playing softball.  He said that he reports playing a lot of basketball.  He reports reduced range of motion with raising arm overhead.  He also reports increased pain when trying to lift weights or push something.  Patient says he has noticed pain into his neck over the past couple of months.  He reports this pain is caused him to have difficulty sleeping.  He does report numbness and tingling in his fingers once today.  It started when he was on his phone scrolling and went away after he let his arm dangle.  He says his shoulder pain does not radiate below the elbow.  He has not tried any OTC meds and this is the first time he has been seen for his concerns.  HPI  History reviewed. No pertinent past medical history.  There are no problems to display for this patient.   History reviewed. No pertinent surgical history.     Home Medications    Prior to Admission medications   Medication Sig Start Date End Date Taking? Authorizing Provider  naproxen (NAPROSYN) 500 MG tablet Take 1 tablet (500 mg total) by mouth 2 (two) times daily. 12/02/22  Yes Eusebio Friendly B, PA-C  tiZANidine (ZANAFLEX) 4 MG tablet Take 1 tablet (4 mg total) by mouth every 8 (eight) hours as needed for up to 7 days. 12/02/22 12/09/22 Yes Shirlee Latch, PA-C    Family History History reviewed. No pertinent family history.  Social History Social History   Tobacco Use   Smoking status: Never   Smokeless tobacco: Never  Vaping Use   Vaping status: Never Used  Substance Use Topics   Alcohol use: Yes   Drug use: Never     Allergies   Patient has no known  allergies.   Review of Systems Review of Systems  Musculoskeletal:  Positive for arthralgias, neck pain and neck stiffness. Negative for back pain and gait problem.  Neurological:  Negative for weakness and numbness (not currently).     Physical Exam Triage Vital Signs ED Triage Vitals  Encounter Vitals Group     BP --      Systolic BP Percentile --      Diastolic BP Percentile --      Pulse --      Resp --      Temp --      Temp src --      SpO2 --      Weight 12/02/22 0935 250 lb (113.4 kg)     Height 12/02/22 0935 5\' 10"  (1.778 m)     Head Circumference --      Peak Flow --      Pain Score 12/02/22 0934 7     Pain Loc --      Pain Education --      Exclude from Growth Chart --    No data found.  Updated Vital Signs BP (!) 150/86 (BP Location: Left Arm)   Pulse 76   Temp 98.8 F (37.1 C) (Oral)   Ht 5\' 10"  (1.778 m)  Wt 250 lb (113.4 kg)   SpO2 94%   BMI 35.87 kg/m      Physical Exam Vitals and nursing note reviewed.  Constitutional:      General: He is not in acute distress.    Appearance: Normal appearance. He is well-developed. He is not ill-appearing.  HENT:     Head: Normocephalic and atraumatic.  Eyes:     General: No scleral icterus.    Conjunctiva/sclera: Conjunctivae normal.  Cardiovascular:     Rate and Rhythm: Normal rate and regular rhythm.  Pulmonary:     Effort: Pulmonary effort is normal. No respiratory distress.     Breath sounds: Normal breath sounds.  Musculoskeletal:     Right shoulder: Tenderness (biceps groove) present. No bony tenderness. Decreased range of motion (reduced to 90 degrees flexion and abduction). Normal pulse.     Cervical back: Neck supple. Tenderness (right trapezius) present. No bony tenderness. No pain with movement. Normal range of motion.  Skin:    General: Skin is warm and dry.     Capillary Refill: Capillary refill takes less than 2 seconds.  Neurological:     General: No focal deficit present.      Mental Status: He is alert. Mental status is at baseline.     Motor: No weakness.     Gait: Gait normal.  Psychiatric:        Mood and Affect: Mood normal.        Behavior: Behavior normal.      UC Treatments / Results  Labs (all labs ordered are listed, but only abnormal results are displayed) Labs Reviewed - No data to display  EKG   Radiology DG Shoulder Right  Result Date: 12/02/2022 CLINICAL DATA:  Right shoulder pain EXAM: RIGHT SHOULDER - 2+ VIEW COMPARISON:  None Available. FINDINGS: There is no evidence of fracture or dislocation. There is no evidence of arthropathy or other focal bone abnormality. Soft tissues are unremarkable. IMPRESSION: Negative. Electronically Signed   By: Helyn Numbers M.D.   On: 12/02/2022 10:32   DG Cervical Spine Complete  Result Date: 12/02/2022 CLINICAL DATA:  Neck pain EXAM: CERVICAL SPINE - COMPLETE 4+ VIEW COMPARISON:  None Available. FINDINGS: There is straightening of the cervical spine, possibly related to underlying muscular spasm. No acute fracture or listhesis of the cervical spine. Vertebral body height is preserved. Mild endplate remodeling at C5-6 is present in keeping with changes of minimal degenerative disc disease. Intervertebral disc heights are preserved. Prevertebral soft tissues are not thickened on sagittal reformats. Spinal canal is widely patent. Oblique views do not optimally profiled the neural foramina, however, at least mild right neuroforaminal narrowing suspected at C3-4. IMPRESSION: 1. Straightening of the cervical spine, possibly related to underlying muscular spasm. 2. Minimal degenerative disc disease at C5-6. 3. At least mild right neuroforaminal narrowing suspected at C3-4. Electronically Signed   By: Helyn Numbers M.D.   On: 12/02/2022 10:32    Procedures Procedures (including critical care time)  Medications Ordered in UC Medications - No data to display  Initial Impression / Assessment and Plan / UC Course   I have reviewed the triage vital signs and the nursing notes.  Pertinent labs & imaging results that were available during my care of the patient were reviewed by me and considered in my medical decision making (see chart for details).   27 year old male presents for 2-year history of intermittent right shoulder pain which is worsened over the past 3 months.  Worse  pain when trying to raise his shoulder or push.  Pain in neck x 3 months and numbness and tingling of fingers.  No injury.  X-rays of C-spine and right shoulder obtained.  Shoulder x-ray is normal.  X-ray of C-spine shows straightening of cervical spine, minimal degenerative disc disease of C5-C6 and mild neuroforaminal narrowing of the C3-C4.  Results of x-rays with patient.  Suspect biceps tendinitis and rotator cuff injury which is a chronic condition in his case.  I have advised him to follow-up with orthopedics as he likely needs an MRI of his shoulder since has been ongoing x 2 years.  No neck pain could potentially be a small disc herniation but he is not really having any radiculopathy or paresthesias.  Muscle spasms suspected given the loss of curvature of C-spine.  Sent naproxen to pharmacy as well as tizanidine.  Encouraged him to follow RICE guidelines.  Reviewed following up with orthopedics.  Exacerbation of chronic condition and new acute condition.  Final Clinical Impressions(s) / UC Diagnoses   Final diagnoses:  Cervicalgia  Degenerative disc disease, cervical  Chronic right shoulder pain     Discharge Instructions      -As discussed, the x-ray of your neck shows mild degenerative disc disease which is essentially arthritis in your neck. - Your shoulder x-ray is normal but this does not rule out tendinitis or rotator cuff injury which is a concern.  Since you have had the shoulder pain off and on for 2 years I would like you to follow-up with orthopedics as you might need to have an MRI of the shoulder the  diagnosis further. - At this time use heat, ice.  I sent an anti-inflammatory medication and a muscle relaxer to the pharmacy for you to use as needed.  You can also take Tylenol as needed.   NECK PAIN RED FLAGS: If symptoms get worse than they are right now, you should come back sooner for re-evaluation. If you have increased numbness/ tingling or notice that the numbness/tingling is affecting the legs or saddle region, go to ER. If you ever lose continence go to ER.      You have a condition requiring you to follow up with Orthopedics so please call one of the following office for appointment:   [facility] [address] [place], [place] [address] Phone: [phone]  *There is also an [facility] in [place] that recently opened.  [facility] [address] [place], [place] [address] Phone: [phone]      ED Prescriptions     Medication Sig Dispense Auth. Provider   naproxen (NAPROSYN) 500 MG tablet Take 1 tablet (500 mg total) by mouth 2 (two) times daily. 30 tablet Eusebio Friendly B, PA-C   tiZANidine (ZANAFLEX) 4 MG tablet Take 1 tablet (4 mg total) by mouth every 8 (eight) hours as needed for up to 7 days. 20 tablet Gareth Morgan      PDMP not reviewed this encounter.   Shirlee Latch, PA-C 12/02/22 1045    Eusebio Friendly B, PA-C 12/02/22 1045

## 2022-12-02 NOTE — Discharge Instructions (Addendum)
-  As discussed, the x-ray of your neck shows mild degenerative disc disease which is essentially arthritis in your neck. - Your shoulder x-ray is normal but this does not rule out tendinitis or rotator cuff injury which is a concern.  Since you have had the shoulder pain off and on for 2 years I would like you to follow-up with orthopedics as you might need to have an MRI of the shoulder the diagnosis further. - At this time use heat, ice.  I sent an anti-inflammatory medication and a muscle relaxer to the pharmacy for you to use as needed.  You can also take Tylenol as needed.   NECK PAIN RED FLAGS: If symptoms get worse than they are right now, you should come back sooner for re-evaluation. If you have increased numbness/ tingling or notice that the numbness/tingling is affecting the legs or saddle region, go to ER. If you ever lose continence go to ER.      You have a condition requiring you to follow up with Orthopedics so please call one of the following office for appointment:   [facility] [address], [place], [place] [address] Phone: [phone]  *There is also an [facility] in [place] that recently opened.  [facility] [address] [place], [place] [address] Phone: [phone]

## 2022-12-13 ENCOUNTER — Encounter

## 2022-12-13 DIAGNOSIS — S86002A Unspecified injury of left Achilles tendon, initial encounter: Secondary | ICD-10-CM | POA: Diagnosis not present

## 2023-07-01 ENCOUNTER — Ambulatory Visit
Admission: EM | Admit: 2023-07-01 | Discharge: 2023-07-01 | Disposition: A | Discharge status: Home / Self Care | Payer: 59

## 2023-07-01 DIAGNOSIS — R030 Elevated blood-pressure reading, without diagnosis of hypertension: Secondary | ICD-10-CM | POA: Diagnosis not present

## 2023-07-01 DIAGNOSIS — K529 Noninfective gastroenteritis and colitis, unspecified: Secondary | ICD-10-CM | POA: Diagnosis not present

## 2023-07-01 DIAGNOSIS — B349 Viral infection, unspecified: Secondary | ICD-10-CM | POA: Diagnosis not present

## 2023-07-01 NOTE — ED Provider Notes (Addendum)
 [facility]    CSN: [id number] Arrival date & time: 07/01/23  1741      History   Chief Complaint Chief Complaint  Patient presents with   Generalized Body Aches   Diarrhea   Vomiting    HPI Bernard Robinson is a 28 y.o. male.   28 year old male pt, Bernard Robinson, presents to urgent care for evaluation of weakness and cough.  Patient states his family has recently been sick with GI bug and flu and he started feeling bad on Monday miss work and has since resolved symptoms needs return to work note.  Patient is drinking well, voiding well  Patient denies smoking drinking or drug use no additional stated past medical history  The history is provided by the patient. No language interpreter was used.    History reviewed. No pertinent past medical history.  Patient Active Problem List   Diagnosis Date Noted   Gastroenteritis 07/01/2023   Viral syndrome 07/01/2023   Elevated blood pressure reading 07/01/2023    History reviewed. No pertinent surgical history.     Home Medications    Prior to Admission medications   Medication Sig Start Date End Date Taking? Authorizing Provider  naproxen  (NAPROSYN ) 500 MG tablet Take 1 tablet (500 mg total) by mouth 2 (two) times daily. 12/02/22   Arvis Jolan NOVAK, PA-C    Family History History reviewed. No pertinent family history.  Social History Social History   Tobacco Use   Smoking status: Never   Smokeless tobacco: Never  Vaping Use   Vaping status: Never Used  Substance Use Topics   Alcohol use: Yes   Drug use: Never     Allergies   Patient has no known allergies.   Review of Systems Review of Systems  Gastrointestinal:  Positive for diarrhea and vomiting.  Musculoskeletal:  Positive for myalgias.  Neurological:  Positive for headaches.  All other systems reviewed and are negative.    Physical Exam Triage Vital Signs ED Triage Vitals  Encounter Vitals Group     BP      Systolic BP Percentile       Diastolic BP Percentile      Pulse      Resp      Temp      Temp src      SpO2      Weight      Height      Head Circumference      Peak Flow      Pain Score      Pain Loc      Pain Education      Exclude from Growth Chart    No data found.  Updated Vital Signs BP (!) 150/86 (BP Location: Left Arm)   Pulse 72   Temp 98.5 F (36.9 C) (Oral)   Ht 5' 10 (1.778 m)   SpO2 97%   BMI 35.87 kg/m   Visual Acuity Right Eye Distance:   Left Eye Distance:   Bilateral Distance:    Right Eye Near:   Left Eye Near:    Bilateral Near:     Physical Exam Vitals and nursing note reviewed.  Constitutional:      General: He is not in acute distress.    Appearance: He is well-developed and well-groomed.  HENT:     Head: Normocephalic and atraumatic.  Eyes:     Conjunctiva/sclera: Conjunctivae normal.  Cardiovascular:     Rate and Rhythm: Normal rate and  regular rhythm.     Heart sounds: Normal heart sounds. No murmur heard. Pulmonary:     Effort: Pulmonary effort is normal. No respiratory distress.     Breath sounds: Normal breath sounds and air entry.  Abdominal:     General: Bowel sounds are normal.     Palpations: Abdomen is soft.     Tenderness: There is no abdominal tenderness.  Musculoskeletal:        General: No swelling.     Cervical back: Neck supple.  Skin:    General: Skin is warm and dry.     Capillary Refill: Capillary refill takes less than 2 seconds.  Neurological:     General: No focal deficit present.     Mental Status: He is alert and oriented to person, place, and time.     GCS: GCS eye subscore is 4. GCS verbal subscore is 5. GCS motor subscore is 6.     Cranial Nerves: No cranial nerve deficit.     Sensory: No sensory deficit.  Psychiatric:        Attention and Perception: Attention normal.        Mood and Affect: Mood normal.        Speech: Speech normal.        Behavior: Behavior normal. Behavior is cooperative.      UC Treatments /  Results  Labs (all labs ordered are listed, but only abnormal results are displayed) Labs Reviewed - No data to display  EKG   Radiology No results found.  Procedures Procedures (including critical care time)  Medications Ordered in UC Medications - No data to display  Initial Impression / Assessment and Plan / UC Course  I have reviewed the triage vital signs and the nursing notes.  Pertinent labs & imaging results that were available during my care of the patient were reviewed by me and considered in my medical decision making (see chart for details).    Discussed exam findings and plan of care with patient, strict go to ER precautions given.   Patient verbalized understanding to this provider.  Ddx: Gastroenteritis, viral syndrome, elevated blood pressure Final Clinical Impressions(s) / UC Diagnoses   Final diagnoses:  Gastroenteritis  Viral syndrome  Elevated blood pressure reading     Discharge Instructions      Most likely you have a viral illness: no antibiotic as indicated at this time, May treat with OTC meds of choice. Make sure to drink plenty of fluids to stay hydrated(gatorade, water, popsicles,jello,etc), avoid caffeine products. Follow up with PCP, recheck blood pressure next week as it was elevated in office today. Return as needed.     ED Prescriptions   None    PDMP not reviewed this encounter.   Aminta Loose, NP 07/01/23 2007    Aminta Loose, NP 07/01/23 2008

## 2023-07-01 NOTE — Discharge Instructions (Addendum)
 Most likely you have a viral illness: no antibiotic as indicated at this time, May treat with OTC meds of choice. Make sure to drink plenty of fluids to stay hydrated(gatorade, water, popsicles,jello,etc), avoid caffeine products. Follow up with PCP, recheck blood pressure next week as it was elevated in office today. Return as needed.

## 2023-07-01 NOTE — ED Triage Notes (Signed)
 Pt c/o body aches, headaches, diarrhea, vomiting x6days  Pt has been around his kids who had covid and the flu  Pt states that the diarrhea and nausea has gone away and body aches have lessened today
